# Patient Record
Sex: Female | Born: 2000 | Race: Black or African American | Hispanic: No | Marital: Single
Health system: Southern US, Community
[De-identification: ages and names within clinical notes are randomized; demographics above are authoritative.]

---

## 2020-09-02 ENCOUNTER — Emergency Department
Admission: EM | Admit: 2020-09-02 | Discharge: 2020-09-02 | Disposition: A | Discharge status: Home / Self Care | Payer: No Typology Code available for payment source | Attending: Emergency Medicine | Admitting: Emergency Medicine

## 2020-09-02 ENCOUNTER — Emergency Department: Payer: No Typology Code available for payment source

## 2020-09-02 ENCOUNTER — Other Ambulatory Visit: Payer: Self-pay

## 2020-09-02 DIAGNOSIS — R079 Chest pain, unspecified: Secondary | ICD-10-CM | POA: Insufficient documentation

## 2020-09-02 DIAGNOSIS — Y92410 Unspecified street and highway as the place of occurrence of the external cause: Secondary | ICD-10-CM | POA: Diagnosis not present

## 2020-09-02 DIAGNOSIS — S199XXA Unspecified injury of neck, initial encounter: Secondary | ICD-10-CM | POA: Diagnosis present

## 2020-09-02 DIAGNOSIS — M25512 Pain in left shoulder: Secondary | ICD-10-CM | POA: Insufficient documentation

## 2020-09-02 DIAGNOSIS — R519 Headache, unspecified: Secondary | ICD-10-CM | POA: Insufficient documentation

## 2020-09-02 DIAGNOSIS — J3489 Other specified disorders of nose and nasal sinuses: Secondary | ICD-10-CM | POA: Insufficient documentation

## 2020-09-02 DIAGNOSIS — S161XXA Strain of muscle, fascia and tendon at neck level, initial encounter: Secondary | ICD-10-CM

## 2020-09-02 DIAGNOSIS — M7918 Myalgia, other site: Secondary | ICD-10-CM

## 2020-09-02 MED ORDER — ORPHENADRINE CITRATE ER 100 MG PO TB12: 100.0000 mg | ORAL_TABLET | Freq: Two times a day (BID) | ORAL | 0 refills | Status: AC

## 2020-09-02 MED ORDER — NAPROXEN 500 MG PO TABS: 500.0000 mg | ORAL_TABLET | Freq: Two times a day (BID) | ORAL | Status: AC

## 2020-09-02 MED ORDER — CYCLOBENZAPRINE HCL 10 MG PO TABS
10.0000 mg | ORAL_TABLET | Freq: Once | ORAL | Status: AC
  Administered 2020-09-02: 10 mg via ORAL
  Filled 2020-09-02: qty 1

## 2020-09-02 MED ORDER — IBUPROFEN 600 MG PO TABS
600.0000 mg | ORAL_TABLET | Freq: Once | ORAL | Status: AC
  Administered 2020-09-02: 600 mg via ORAL
  Filled 2020-09-02: qty 1

## 2020-09-02 MED ORDER — TRAMADOL HCL 50 MG PO TABS: 50.0000 mg | ORAL_TABLET | Freq: Four times a day (QID) | ORAL | 0 refills | Status: AC | PRN

## 2020-09-02 MED ORDER — TRAMADOL HCL 50 MG PO TABS
50.0000 mg | ORAL_TABLET | Freq: Once | ORAL | Status: AC
  Administered 2020-09-02: 50 mg via ORAL
  Filled 2020-09-02: qty 1

## 2020-09-02 NOTE — Discharge Instructions (Signed)
Follow discharge care instruction take medication as directed. °
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
## 2024-07-06 ENCOUNTER — Encounter (HOSPITAL_COMMUNITY): Payer: Self-pay | Admitting: Emergency Medicine

## 2024-07-06 ENCOUNTER — Other Ambulatory Visit: Payer: Self-pay

## 2024-07-06 ENCOUNTER — Emergency Department (HOSPITAL_COMMUNITY)

## 2024-07-06 ENCOUNTER — Emergency Department (HOSPITAL_COMMUNITY)
Admission: EM | Admit: 2024-07-06 | Discharge: 2024-07-06 | Disposition: A | Discharge status: Home / Self Care | Attending: Emergency Medicine | Admitting: Emergency Medicine

## 2024-07-06 DIAGNOSIS — R569 Unspecified convulsions: Secondary | ICD-10-CM | POA: Insufficient documentation

## 2024-07-06 LAB — CBC WITH DIFFERENTIAL/PLATELET
Abs Immature Granulocytes: 0.02 K/uL (ref 0.00–0.07)
Basophils Absolute: 0 K/uL (ref 0.0–0.1)
Basophils Relative: 0 %
Eosinophils Absolute: 0 K/uL (ref 0.0–0.5)
Eosinophils Relative: 0 %
HCT: 35.2 % — ABNORMAL LOW (ref 36.0–46.0)
Hemoglobin: 11.4 g/dL — ABNORMAL LOW (ref 12.0–15.0)
Immature Granulocytes: 0 %
Lymphocytes Relative: 7 %
Lymphs Abs: 0.8 K/uL (ref 0.7–4.0)
MCH: 25.1 pg — ABNORMAL LOW (ref 26.0–34.0)
MCHC: 32.4 g/dL (ref 30.0–36.0)
MCV: 77.4 fL — ABNORMAL LOW (ref 80.0–100.0)
Monocytes Absolute: 0.5 K/uL (ref 0.1–1.0)
Monocytes Relative: 5 %
Neutro Abs: 9.3 K/uL — ABNORMAL HIGH (ref 1.7–7.7)
Neutrophils Relative %: 88 %
Platelets: 419 K/uL — ABNORMAL HIGH (ref 150–400)
RBC: 4.55 MIL/uL (ref 3.87–5.11)
RDW: 16.6 % — ABNORMAL HIGH (ref 11.5–15.5)
WBC: 10.6 K/uL — ABNORMAL HIGH (ref 4.0–10.5)
nRBC: 0 % (ref 0.0–0.2)

## 2024-07-06 LAB — BASIC METABOLIC PANEL WITH GFR
Anion gap: 16 — ABNORMAL HIGH (ref 5–15)
BUN: 12 mg/dL (ref 6–20)
CO2: 17 mmol/L — ABNORMAL LOW (ref 22–32)
Calcium: 9 mg/dL (ref 8.9–10.3)
Chloride: 108 mmol/L (ref 98–111)
Creatinine, Ser: 0.75 mg/dL (ref 0.44–1.00)
GFR, Estimated: >60 mL/min (ref >60)
Glucose, Bld: 111 mg/dL — ABNORMAL HIGH (ref 70–99)
Potassium: 3.8 mmol/L (ref 3.5–5.1)
Sodium: 140 mmol/L (ref 135–145)

## 2024-07-06 LAB — HEPATIC FUNCTION PANEL
ALT: 9 U/L (ref 0–44)
AST: 21 U/L (ref 15–41)
Albumin: 4.7 g/dL (ref 3.5–5.0)
Alkaline Phosphatase: 97 U/L (ref 38–126)
Bilirubin, Direct: 0.2 mg/dL (ref 0.0–0.2)
Indirect Bilirubin: 0.2 mg/dL — ABNORMAL LOW (ref 0.3–0.9)
Total Bilirubin: 0.3 mg/dL (ref 0.0–1.2)
Total Protein: 7.8 g/dL (ref 6.5–8.1)

## 2024-07-06 MED ORDER — LEVETIRACETAM (KEPPRA) 500 MG/5 ML ADULT IV PUSH
1000.0000 mg | Freq: Once | INTRAVENOUS | Status: AC
  Administered 2024-07-06: 1000 mg via INTRAVENOUS
  Filled 2024-07-06: qty 10

## 2024-07-06 MED ORDER — SODIUM CHLORIDE 0.9 % IV BOLUS
500.0000 mL | Freq: Once | INTRAVENOUS | Status: AC
  Administered 2024-07-06: 500 mL via INTRAVENOUS

## 2024-07-06 NOTE — Discharge Instructions (Signed)
 Please call your Neurology team today to discuss you symptoms. Continue your home seizure medications. Return with any new or worsening symptoms.
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
## 2024-07-06 NOTE — ED Notes (Addendum)
 PT refused MRI. Education for risks and benefits were attempted

## 2024-07-06 NOTE — ED Provider Notes (Signed)
 Emergency Department Provider Note   I have reviewed the triage vital signs and the nursing notes.   HISTORY  Chief Complaint Seizures   HPI Annette Jackson is a 23 y.o. female past medical history reviewed including seizures on Keppra and newly starting Zonisamide.  Patient is followed by her neurologist at [facility] (Dr. Jorie Furth Mandge) and was last seen on 10/23.  Plan at that appointment was to add the Zonisamide and order home EEG along with MRI brain with contrast.  Patient apparently had a breakthrough seizure this morning.  She states she went to bed at 8 AM and has been compliant with her home medications.  She denies any drug or alcohol use.  Level 5 caveat: post-ictal   History reviewed. No pertinent past medical history.  Review of Systems  Level 5 caveat: Post-ictal  ____________________________________________   PHYSICAL EXAM:  VITAL SIGNS: ED Triage Vitals  Encounter Vitals Group     BP 07/06/24 0930 133/87     Pulse Rate 07/06/24 0930 80     Resp 07/06/24 0930 (!) 22     Temp 07/06/24 0930 98.7 F (37.1 C)     Temp src --      SpO2 07/06/24 0930 99 %     Weight 07/06/24 0934 126 lb (57.2 kg)     Height 07/06/24 0934 5' 2 (1.575 m)   Constitutional: Drowsy with some confusion but able to provide a basic history. Answering basic questions with prompting.  Eyes: Conjunctivae are normal.  Head: Atraumatic. Nose: No congestion/rhinnorhea. Mouth/Throat: Mucous membranes are moist.  Neck: No stridor.   Cardiovascular: Normal rate, regular rhythm. Good peripheral circulation. Grossly normal heart sounds.   Respiratory: Normal respiratory effort.  No retractions. Lungs CTAB. Gastrointestinal: Soft and nontender. No distention.  Musculoskeletal: No lower extremity tenderness nor edema. No gross deformities of extremities. Neurologic:  Normal speech and language. No gross focal neurologic deficits are appreciated.  Skin:  Skin is warm, dry and intact.  No rash noted.   ____________________________________________   LABS (all labs ordered are listed, but only abnormal results are displayed)  Labs Reviewed  BASIC METABOLIC PANEL WITH GFR - Abnormal; Notable for the following components:      Result Value   CO2 17 (*)    Glucose, Bld 111 (*)    Anion gap 16 (*)    All other components within normal limits  CBC WITH DIFFERENTIAL/PLATELET - Abnormal; Notable for the following components:   WBC 10.6 (*)    Hemoglobin 11.4 (*)    HCT 35.2 (*)    MCV 77.4 (*)    MCH 25.1 (*)    RDW 16.6 (*)    Platelets 419 (*)    Neutro Abs 9.3 (*)    All other components within normal limits  HEPATIC FUNCTION PANEL - Abnormal; Notable for the following components:   Indirect Bilirubin 0.2 (*)    All other components within normal limits  PREGNANCY, URINE  URINALYSIS, W/ REFLEX TO CULTURE (INFECTION SUSPECTED)   ____________________________________________  EKG   EKG Interpretation Date/Time:  Tuesday July 06 2024 09:36:45 EST Ventricular Rate:  65 PR Interval:  141 QRS Duration:  85 QT Interval:  404 QTC Calculation: 420 R Axis:   76  Text Interpretation: Sinus arrhythmia Confirmed by Darra Chew [phone]) on 07/06/2024 10:31:56 AM        ____________________________________________  RADIOLOGY  No results found.  ____________________________________________   PROCEDURES  Procedure(s) performed:   Procedures  ____________________________________________   INITIAL IMPRESSION / ASSESSMENT AND PLAN / ED COURSE  Pertinent labs & imaging results that were available during my care of the patient were reviewed by me and considered in my medical decision making (see chart for details).   This patient is Presenting for Evaluation of seizure, which does require a range of treatment options, and is a complaint that involves a high risk of morbidity and mortality.  The Differential Diagnoses includes but is not exclusive  to alcohol, illicit or prescription medications, intracranial pathology such as stroke, intracerebral hemorrhage, fever or infectious causes including sepsis, hypoxemia, uremia, trauma, endocrine related disorders such as diabetes, hypoglycemia, thyroid-related diseases, etc.   Critical Interventions-    Medications  sodium chloride 0.9 % bolus 500 mL (500 mLs Intravenous Bolus from Bag 07/06/24 1010)  levETIRAcetam (KEPPRA) undiluted injection 1,000 mg (1,000 mg Intravenous Given 07/06/24 1009)    Reassessment after intervention:  no additional seizures.   I decided to review pertinent External Data, and in summary reviewed last Neuro note from [place].   Clinical Laboratory Tests Ordered, included CBC with mild leukocytosis of 10.6.  Mild anemia at 11.4.  No acute kidney droop.  Radiologic Tests Ordered, included MRI brain. I independently interpreted the images and agree with radiology interpretation.   Cardiac Monitor Tracing which shows NSR.    Social Determinants of Health Risk patient is a smoker.   Consult complete with  Medical Decision Making: Summary:  Patient presents to the emergency department with breakthrough seizure.  She is primarily followed at [facility].  I will try and advance the outpatient workup with MRI today along with screening blood work.  Will give a Keppra load of 1 g had a slight increase from her normal dose of 750 mg twice daily.   Reevaluation with update and discussion with patient. She is awake and alert. Back to baseline. He roommate is here and patient is asking for discharge to care for her child. She does not want to stay for any additional imaging or labs/observation. She has good Neurology follow up and d/c seems appropriate. Strict ED return precautions given.   Considered admission but patient back to baseline and requesting discharge.   Patient's presentation is most consistent with acute presentation with potential threat to life or bodily function.    Disposition: discharge  ____________________________________________  FINAL CLINICAL IMPRESSION(S) / ED DIAGNOSES  Final diagnoses:  Seizures (HCC)    Note:  This document was prepared using Dragon voice recognition software and may include unintentional dictation errors.  Fonda Law, MD, [facility]    Jamilex Bohnsack, Fonda MATSU, MD 07/06/24 [phone]
